# Patient Record
Sex: Male | Born: 2000 | Race: White | Hispanic: No | Marital: Single | Smoking: Never smoker
Health system: Southern US, Community
[De-identification: ages and names within clinical notes are randomized; demographics above are authoritative.]

---

## 2018-06-02 ENCOUNTER — Encounter: Payer: Self-pay | Admitting: Emergency Medicine

## 2018-06-02 ENCOUNTER — Other Ambulatory Visit: Payer: Self-pay

## 2018-06-02 ENCOUNTER — Emergency Department
Admission: EM | Admit: 2018-06-02 | Discharge: 2018-06-02 | Disposition: A | Discharge status: Home / Self Care | Payer: 59 | Attending: Emergency Medicine | Admitting: Emergency Medicine

## 2018-06-02 ENCOUNTER — Emergency Department: Payer: 59

## 2018-06-02 DIAGNOSIS — W228XXA Striking against or struck by other objects, initial encounter: Secondary | ICD-10-CM | POA: Diagnosis not present

## 2018-06-02 DIAGNOSIS — Y939 Activity, unspecified: Secondary | ICD-10-CM | POA: Diagnosis not present

## 2018-06-02 DIAGNOSIS — Y929 Unspecified place or not applicable: Secondary | ICD-10-CM | POA: Diagnosis not present

## 2018-06-02 DIAGNOSIS — S098XXA Other specified injuries of head, initial encounter: Secondary | ICD-10-CM | POA: Insufficient documentation

## 2018-06-02 DIAGNOSIS — Y999 Unspecified external cause status: Secondary | ICD-10-CM | POA: Insufficient documentation

## 2018-06-02 DIAGNOSIS — Z5321 Procedure and treatment not carried out due to patient leaving prior to being seen by health care provider: Secondary | ICD-10-CM | POA: Insufficient documentation

## 2018-06-02 DIAGNOSIS — S0191XA Laceration without foreign body of unspecified part of head, initial encounter: Secondary | ICD-10-CM | POA: Insufficient documentation

## 2018-06-02 NOTE — ED Notes (Signed)
No answer when called several times from lobby 
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
## 2018-06-02 NOTE — ED Notes (Signed)
Charting error: Triage was completed all by this RN.

## 2018-06-02 NOTE — ED Notes (Signed)
Formatting of this note might be different from the original.  Charting error: Triage was completed all by this RN.    Electronically signed by Gabriela Eves, RN at 06/02/2018  2:37 AM EST

## 2018-06-02 NOTE — ED Notes (Signed)
Formatting of this note might be different from the original.  No answer when called several times from lobby  Electronically signed by Gordy Levan, RN at 06/02/2018  6:50 AM EST
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
## 2022-01-30 ENCOUNTER — Inpatient Hospital Stay
Admit: 2022-01-30 | Discharge: 2022-01-30 | Disposition: A | Discharge status: Home / Self Care | Payer: PRIVATE HEALTH INSURANCE | Attending: Emergency Medical Services

## 2022-01-30 ENCOUNTER — Emergency Department: Admit: 2022-01-30 | Payer: PRIVATE HEALTH INSURANCE

## 2022-01-30 DIAGNOSIS — S91312A Laceration without foreign body, left foot, initial encounter: Secondary | ICD-10-CM

## 2022-01-30 MED ORDER — DOXYCYCLINE HYCLATE 100 MG PO TABS
100 MG | ORAL_TABLET | Freq: Two times a day (BID) | ORAL | 0 refills | Status: AC
Start: 2022-01-30 — End: 2022-02-06

## 2022-01-30 MED ORDER — TETANUS-DIPHTH-ACELL PERTUSSIS 5-2.5-18.5 LF-MCG/0.5 IM SUSY
Freq: Once | INTRAMUSCULAR | Status: AC
Start: 2022-01-30 — End: 2022-01-30

## 2022-01-30 MED ORDER — TETANUS-DIPHTH-ACELL PERTUSSIS 5-2.5-18.5 LF-MCG/0.5 IM SUSY
INTRAMUSCULAR | Status: AC
Start: 2022-01-30 — End: 2022-01-30
  Administered 2022-01-30: 19:00:00 0.5 via INTRAMUSCULAR

## 2022-01-30 MED ORDER — LIDOCAINE HCL 1 % IJ SOLN
1 % | Freq: Once | INTRAMUSCULAR | Status: AC
Start: 2022-01-30 — End: 2022-01-30
  Administered 2022-01-30: 19:00:00 20 mL via INTRADERMAL

## 2022-01-30 MED FILL — LIDOCAINE HCL 1 % IJ SOLN: 1 % | INTRAMUSCULAR | Qty: 20

## 2022-01-30 MED FILL — BOOSTRIX 5-2.5-18.5 LF-MCG/0.5 IM SUSY: INTRAMUSCULAR | Qty: 0.5

## 2022-01-30 NOTE — Discharge Instructions (Signed)
Please follow-up with your primary care provider in 3 to 4 days for wound reevaluation.  You will need to have the sutures removed in 10 to 14 days.  Please return to the ER if you notice any redness or discharge from the wound, worsening swelling or pain from the wound, fevers or chills.  Please apply antibiotic ointment to the wound 2-3 times per day.
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
## 2022-01-30 NOTE — ED Notes (Signed)
Wound area cleaned,triple antibiotic ointment applied,nonstick and gauze place,explained to pt,dressing changes and s/s of infection     Durenda Guthrie, RN  01/30/22 [phone]

## 2022-02-01 NOTE — Telephone Encounter (Signed)
I left a voice message for the patient to call back at their earliest convenience to schedule an appointment with Dr. Marino.     The patient needs a ED FOLLOW UP appointment with Dr. Marino.    Whoever takes the return call may schedule the appointment with Dr. Marino.

## 2022-02-01 NOTE — Telephone Encounter (Signed)
-----   Message from Roylene Reason, ATC sent at 02/01/2022  6:48 AM EDT -----    ----- Message -----  From: Roe Coombs, MD  Sent: 01/30/2022   4:32 PM EDT  To: Horald Chestnut, DPM
